# Patient Record
Sex: Female | Born: 1980 | Race: White | Hispanic: No | Marital: Married | Smoking: Never smoker
Health system: Southern US, Community
[De-identification: ages and names within clinical notes are randomized; demographics above are authoritative.]

## PROBLEM LIST (undated history)

## (undated) DIAGNOSIS — N61 Mastitis without abscess: Secondary | ICD-10-CM

## (undated) DIAGNOSIS — G40909 Epilepsy, unspecified, not intractable, without status epilepticus: Secondary | ICD-10-CM

## (undated) DIAGNOSIS — I10 Essential (primary) hypertension: Secondary | ICD-10-CM

## (undated) DIAGNOSIS — J45909 Unspecified asthma, uncomplicated: Secondary | ICD-10-CM

## (undated) HISTORY — PX: EXTERNAL EAR SURGERY: SHX627

## (undated) HISTORY — DX: Mastitis without abscess: N61.0

## (undated) HISTORY — DX: Epilepsy, unspecified, not intractable, without status epilepticus: G40.909

## (undated) HISTORY — DX: Unspecified asthma, uncomplicated: J45.909

## (undated) HISTORY — DX: Essential (primary) hypertension: I10

## (undated) HISTORY — PX: TONSILLECTOMY: SUR1361

---
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
## 2007-07-24 ENCOUNTER — Emergency Department (HOSPITAL_COMMUNITY): Admission: EM | Admit: 2007-07-24 | Discharge: 2007-07-24 | Payer: Self-pay | Admitting: Emergency Medicine

## 2009-07-05 ENCOUNTER — Ambulatory Visit: Payer: Self-pay | Admitting: Internal Medicine

## 2009-08-04 ENCOUNTER — Ambulatory Visit: Payer: Self-pay | Admitting: Internal Medicine

## 2010-10-15 DIAGNOSIS — I10 Essential (primary) hypertension: Secondary | ICD-10-CM

## 2010-10-15 HISTORY — DX: Essential (primary) hypertension: I10

## 2011-04-20 ENCOUNTER — Observation Stay: Payer: Self-pay | Admitting: Obstetrics and Gynecology

## 2011-06-07 ENCOUNTER — Inpatient Hospital Stay: Payer: Self-pay | Admitting: Internal Medicine

## 2011-07-16 ENCOUNTER — Ambulatory Visit: Payer: Self-pay | Admitting: Family Medicine

## 2011-07-25 ENCOUNTER — Ambulatory Visit: Payer: Self-pay | Admitting: General Surgery

## 2011-10-21 ENCOUNTER — Ambulatory Visit: Payer: Self-pay | Admitting: Internal Medicine

## 2011-10-21 LAB — RAPID INFLUENZA A&B ANTIGENS

## 2012-05-23 IMAGING — US US OB US >=[ID] SNGL FETUS
1 series · 17 of 28 positions shown · non-contrast
Comparison: none

REASON FOR EXAM: Elevated BP's at 35 weeks evaluate fetal growth
COMMENTS:

[Series 1: us ob us >=(id) sngl fetus · 17 of 83 slices shown]
[im 1/83]
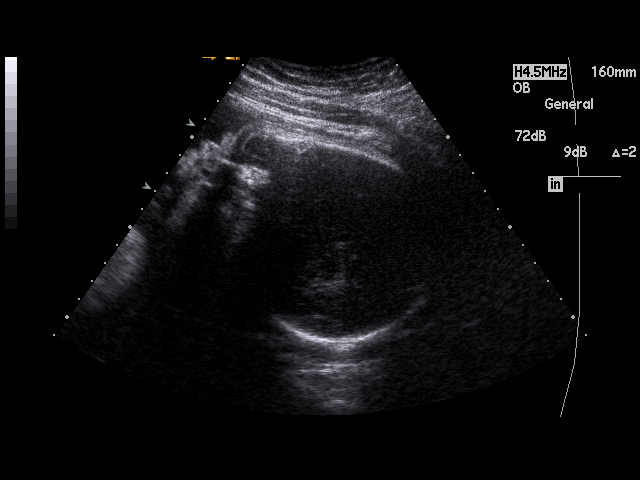
[im 7/83]
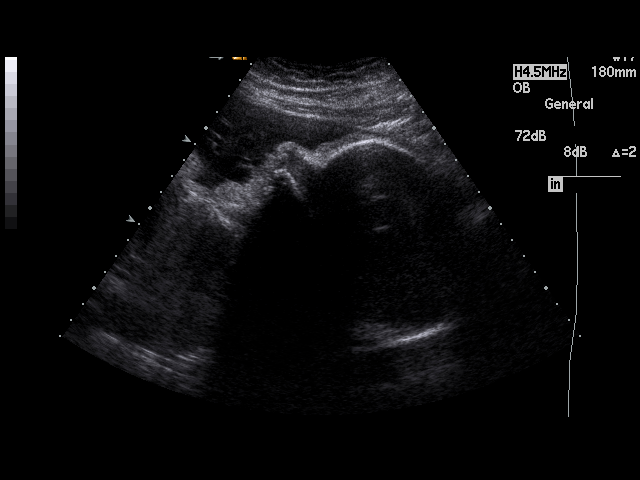
[im 13/83]
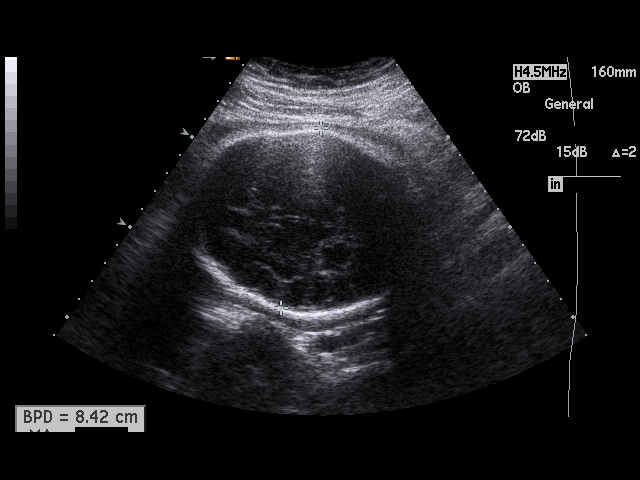
[im 16/83]
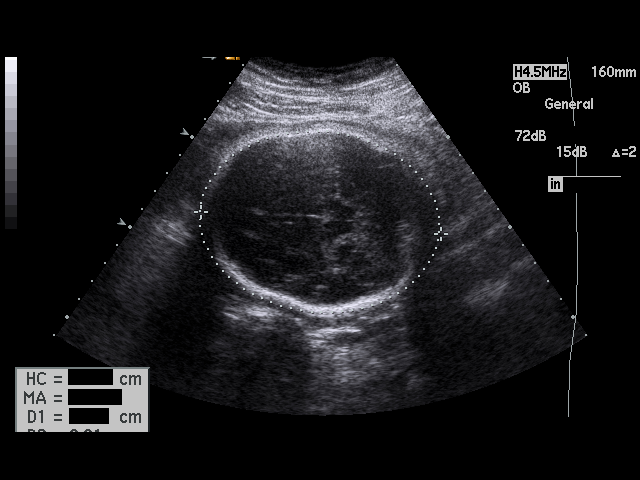
[im 22/83]
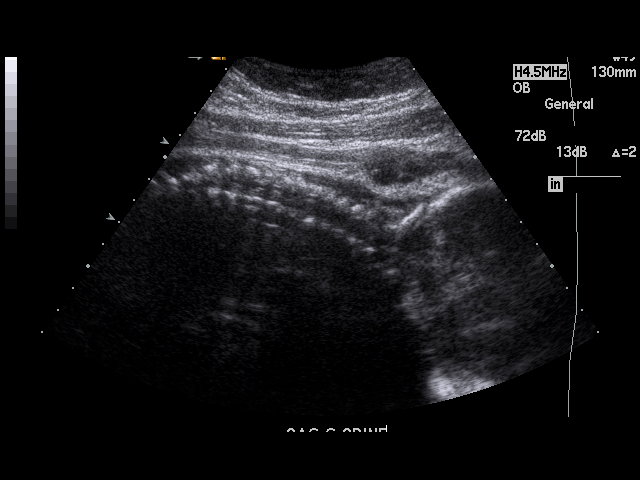
[im 28/83]
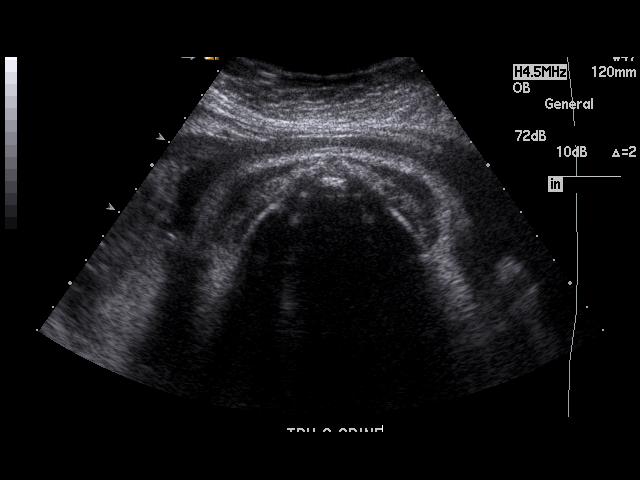
[im 31/83]
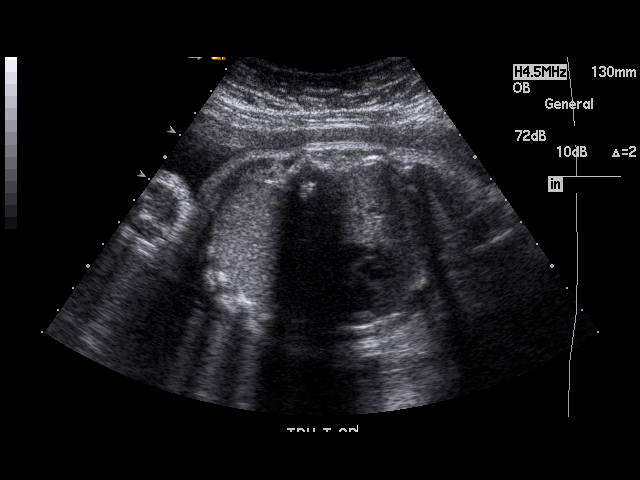
[im 37/83]
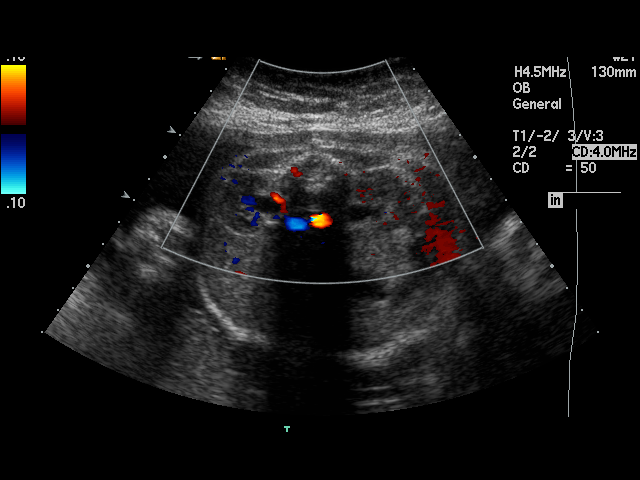
[im 43/83]
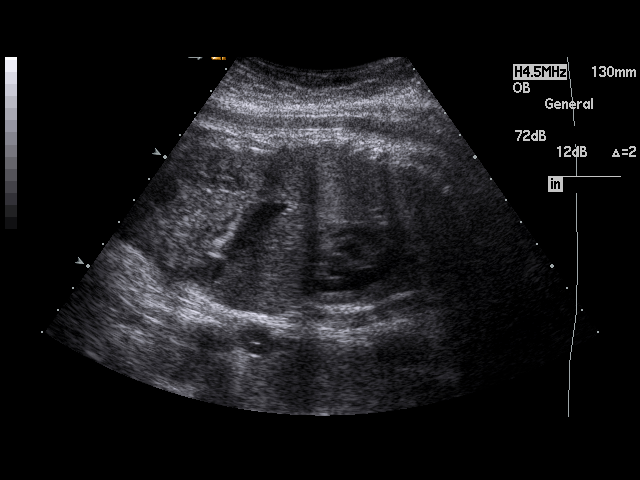
[im 46/83]
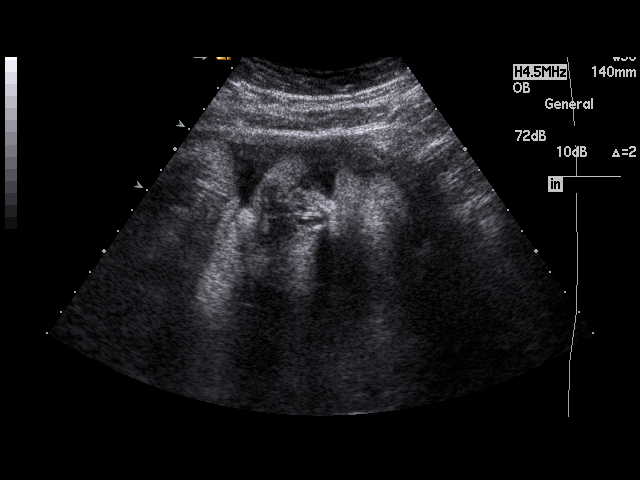
[im 52/83]
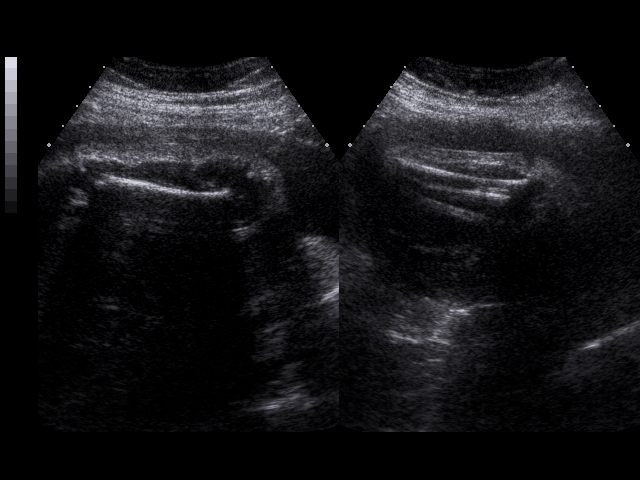
[im 55/83]
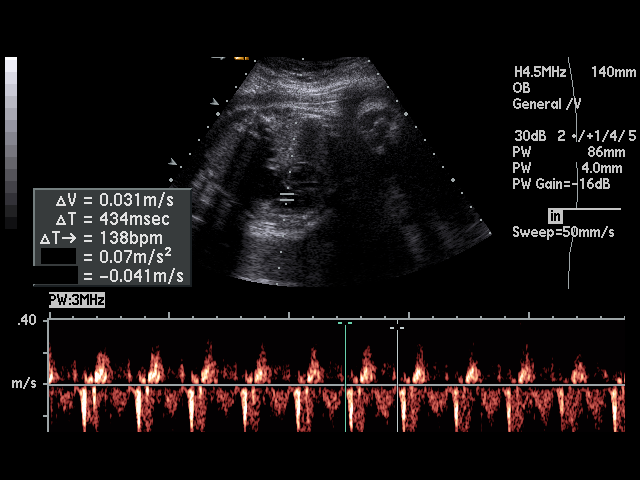
[im 61/83]
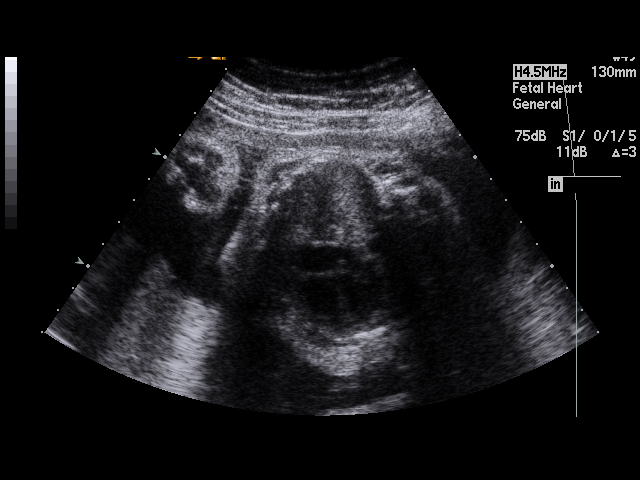
[im 67/83]
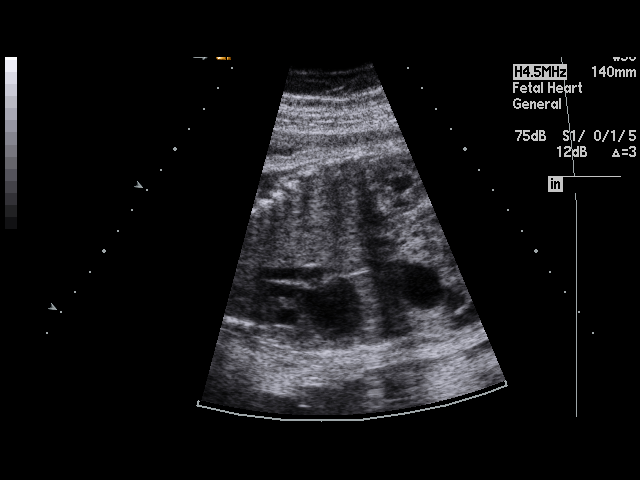
[im 70/83]
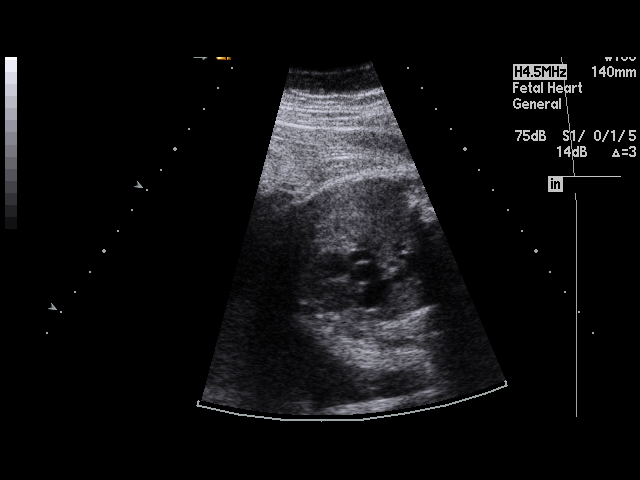
[im 76/83]
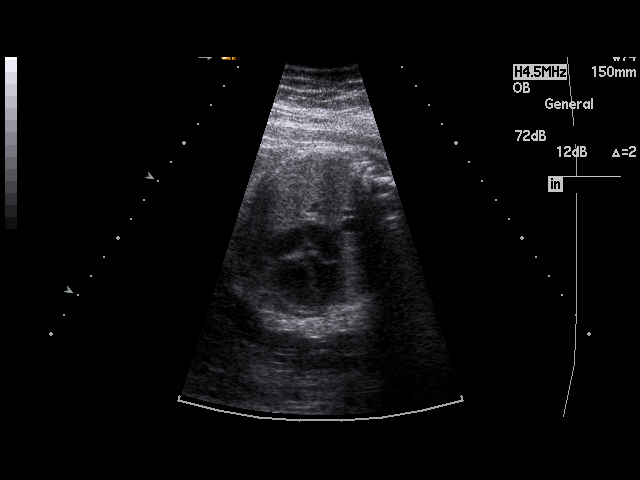
[im 83/83]
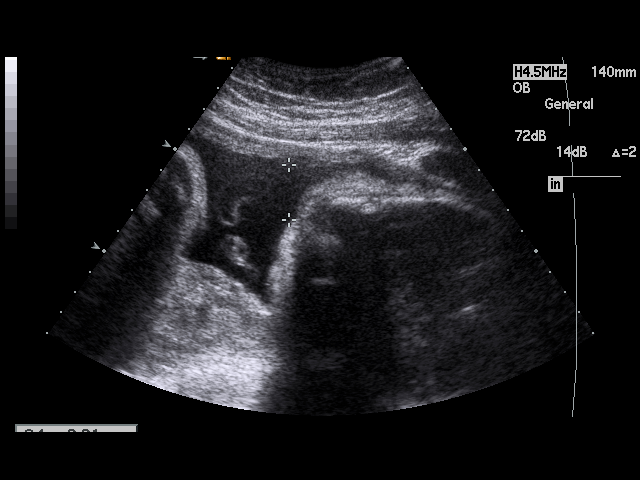

[17 of 28 positions shown; findings below may reference images not displayed]

PROCEDURE:     US  - US OB GREATER/OR EQUAL TO 24327  - June 08, 2011 [DATE]

RESULT:     There is observed a single living intrauterine gestation.
Presentation currently is cephalic. Fetal heart rate was monitored at 138
beats per minute. The placenta is posterior. The inferior tip of the
placenta is approximately 11.3 cm above the cervix. Amnionic fluid volume
appears normal. AFI measures 11.8 cm. The fetal heart, stomach and urinary
bladder are visualized. No hydrocephalus or hydronephrosis is seen.

Fetal measurements are as follows:

          BPD is 8.3 cm, corresponding to 33 weeks, 3 days
           HC is 30.92 cm, corresponding to 34 weeks, 4 days
           AC is 30.77 cm, corresponding to 34 weeks, 5 days
           FL is 6.76 cm, corresponding to 34 weeks, 5 days
           HL is 5.86 cm, corresponding to 34 weeks, 0 days

EFW is 2,446 grams + / - 367 grams. AFI measures 11.8 cm. Average ultrasound
age based on today's measurements is 34 weeks, 2 days and ultrasound EDD
based on today's measurements is July 18, 2011.
IMPRESSION: Please see above.

## 2013-04-28 ENCOUNTER — Encounter: Payer: Self-pay | Admitting: *Deleted

## 2013-05-20 ENCOUNTER — Ambulatory Visit: Payer: Self-pay | Admitting: Obstetrics and Gynecology

## 2013-05-20 LAB — CBC
HCT: 43.6 % (ref 35.0–47.0)
HGB: 15.4 g/dL (ref 12.0–16.0)
MCHC: 35.4 g/dL (ref 32.0–36.0)
RDW: 12.6 % (ref 11.5–14.5)
WBC: 8.8 10*3/uL (ref 3.6–11.0)

## 2013-05-20 LAB — URINALYSIS, COMPLETE
Bilirubin,UR: NEGATIVE
Blood: NEGATIVE
Ketone: NEGATIVE
Leukocyte Esterase: NEGATIVE
Protein: NEGATIVE
RBC,UR: <1 /HPF (ref 0–5)
Specific Gravity: 1.019 (ref 1.003–1.030)
WBC UR: 1 /HPF (ref 0–5)

## 2013-05-21 ENCOUNTER — Ambulatory Visit: Payer: Self-pay | Admitting: Obstetrics and Gynecology

## 2013-05-25 LAB — PATHOLOGY REPORT

## 2014-08-16 ENCOUNTER — Encounter: Payer: Self-pay | Admitting: *Deleted

## 2014-12-22 DIAGNOSIS — O09529 Supervision of elderly multigravida, unspecified trimester: Secondary | ICD-10-CM | POA: Insufficient documentation

## 2014-12-22 DIAGNOSIS — O09891 Supervision of other high risk pregnancies, first trimester: Secondary | ICD-10-CM | POA: Insufficient documentation

## 2014-12-22 DIAGNOSIS — J4520 Mild intermittent asthma, uncomplicated: Secondary | ICD-10-CM | POA: Insufficient documentation

## 2014-12-22 DIAGNOSIS — O10919 Unspecified pre-existing hypertension complicating pregnancy, unspecified trimester: Secondary | ICD-10-CM | POA: Insufficient documentation

## 2014-12-22 DIAGNOSIS — Z7185 Encounter for immunization safety counseling: Secondary | ICD-10-CM | POA: Insufficient documentation

## 2014-12-23 DIAGNOSIS — O99210 Obesity complicating pregnancy, unspecified trimester: Secondary | ICD-10-CM | POA: Insufficient documentation

## 2015-02-04 NOTE — Op Note (Signed)
PATIENT NAME:  Elizabeth Mckee, Elizabeth Mckee MR#:  [id number] DATE OF BIRTH:  12/13/1980  DATE OF PROCEDURE:  05/21/2013  PREOPERATIVE DIAGNOSIS: Missed abortion.   POSTOPERATIVE DIAGNOSIS: Missed abortion.   PROCEDURE: Suction D and C.   ANESTHESIA: LMA.   SURGEON: Senaida LangeLashawn Weaver-Lee, MD.   ESTIMATED BLOOD LOSS: 50 mL.  OPERATIVE FLUIDS: 400 mL.   COMPLICATIONS: None.   FINDINGS: Products of conception.   INDICATIONS: The patient is a 34 year old, who presents with a missed AB, who desired surgical management. Risks, benefits, indications and alternatives of the procedure were explained and informed consent was obtained.   PROCEDURE: The patient was taken to the operating room with IV fluids running. She was prepped and draped in the usual sterile fashion and placed in candy cane stirrups. A speculum was placed inside of the vagina. The anterior lip of the cervix was grasped with a single-tooth  tenaculum. The uterus was sounded. The cervix was dilated using Hank dilators. An 8 mm suction curette was placed inside of the uterus and products of conception were removed and seen coming through the curette. After suctioning, the uterus was gently curetted using both serrated and straight edge banjo curettes. A gritty feeling was noted to the uterus indicating that all products had been removed. This ended the procedure. The patient tolerated the procedure well. Sponge and instrument counts were correct x 2. The patient was awakened from anesthesia and taken to the recovery room.    ____________________________ Sonda PrimesLashawn A. Patton SallesWeaver-Lee, MD law:aw D: 05/21/2013 12:42:22 ET T: 05/21/2013 13:15:29 ET JOB#: [id number]  cc: Flint MelterLashawn A. Patton SallesWeaver-Lee, MD, <Dictator> Sonda PrimesLASHAWN A WEAVER LEE MD ELECTRONICALLY SIGNED 05/21/2013 15:50

## 2015-11-17 ENCOUNTER — Encounter: Payer: Self-pay | Admitting: *Deleted

## 2016-01-30 ENCOUNTER — Encounter: Payer: Self-pay | Admitting: General Surgery

## 2016-01-31 ENCOUNTER — Ambulatory Visit: Payer: Self-pay | Admitting: General Surgery

## 2016-03-05 ENCOUNTER — Encounter: Payer: Self-pay | Admitting: *Deleted

## 2016-03-21 ENCOUNTER — Encounter: Payer: Self-pay | Admitting: *Deleted

## 2018-01-03 DIAGNOSIS — H7292 Unspecified perforation of tympanic membrane, left ear: Secondary | ICD-10-CM | POA: Insufficient documentation

## 2018-01-03 DIAGNOSIS — H6690 Otitis media, unspecified, unspecified ear: Secondary | ICD-10-CM | POA: Insufficient documentation

## 2022-02-08 ENCOUNTER — Ambulatory Visit (INDEPENDENT_AMBULATORY_CARE_PROVIDER_SITE_OTHER): Payer: BLUE CROSS/BLUE SHIELD

## 2022-02-08 ENCOUNTER — Ambulatory Visit
Admission: EM | Admit: 2022-02-08 | Discharge: 2022-02-08 | Disposition: A | Discharge status: Home / Self Care | Payer: BLUE CROSS/BLUE SHIELD | Attending: Emergency Medicine | Admitting: Emergency Medicine

## 2022-02-08 DIAGNOSIS — M25572 Pain in left ankle and joints of left foot: Secondary | ICD-10-CM | POA: Diagnosis not present

## 2022-02-08 DIAGNOSIS — S82842A Displaced bimalleolar fracture of left lower leg, initial encounter for closed fracture: Secondary | ICD-10-CM

## 2022-02-08 MED ORDER — HYDROCODONE-ACETAMINOPHEN 5-325 MG PO TABS: 1.0000 | ORAL_TABLET | Freq: Four times a day (QID) | ORAL | 0 refills | Status: AC | PRN

## 2022-02-08 NOTE — ED Triage Notes (Signed)
Patient is here for "Left ankle pain". DOI: [id number]. "@ [place], walking, hitting a slick patch, ankle rolled and collapsed, causing ankle pain, swelling". No head injury. No lacerations.  ?

## 2022-02-08 NOTE — Discharge Instructions (Addendum)
Follow up with ortho in 1-4 ?Elevate leg when resting.  ?Apply ice on area  with cloth over the skin for 20 minutes 3-4 times a day today and tomorrow.  ?

## 2022-02-08 NOTE — ED Notes (Signed)
LL (Long leg, posterior splint applied) with assistance from family member. Instructions, educations and information provided while placing (per protocol). Lurline Idol CMA ?

## 2022-02-08 NOTE — ED Provider Notes (Signed)
?[facility] ? ? ? ?CSN: [id number] ?Arrival date & time: 02/08/22  1814 ? ? ?  ? ?History   ?Chief Complaint ?Chief Complaint  ?Patient presents with  ? Ankle Pain  ?  Left, Due to injury.  ? ? ?HPI ?Elizabeth Mckee is a 41 y.o. female who presents with L ankle pain since she slipped and felled and twisted her L ankle. She fell down, but did not hit her head. Only has pain in her ankle.  ? ? ? ?Past Medical History:  ?Diagnosis Date  ? Asthma   ? Epilepsy (HCC) 1984  ? Hypertension 2012  ? Mastitis   ? ? ?Patient Active Problem List  ? Diagnosis Date Noted  ? Perforation of left tympanic membrane 01/03/2018  ? Ear infection 01/03/2018  ? Postpartum care following vaginal delivery 07/22/2015  ? Obesity affecting pregnancy 12/23/2014  ? Supervision of other high risk pregnancies, first trimester 12/22/2014  ? Mild intermittent asthma without complication 12/22/2014  ? Immunization counseling 12/22/2014  ? Chronic hypertension during pregnancy, antepartum 12/22/2014  ? AMA (advanced maternal age) multigravida 35+ 12/22/2014  ? ? ?Past Surgical History:  ?Procedure Laterality Date  ? EXTERNAL EAR SURGERY  1981-1988  ? tubes eardrum reconstruction   ? TONSILLECTOMY    ? wisdom teeth removal   2000  ? ? ?OB History   ? ? Gravida  ?1  ? Para  ?1  ? Term  ?   ? Preterm  ?   ? AB  ?   ? Living  ?1  ?  ? ? SAB  ?   ? IAB  ?   ? Ectopic  ?   ? Multiple  ?   ? Live Births  ?   ?   ?  ? Obstetric Comments  ?1st Menstrual Cycle:  12 ?1st Pregnancy:  30  ?  ? ?  ? ? ? ?Home Medications   ? ?Prior to Admission medications   ?Medication Sig Start Date End Date Taking? Authorizing Provider  ?ADVAIR DISKUS 250-50 MCG/ACT AEPB Inhale 1 puff into the lungs 2 (two) times daily. 01/19/22  Yes [provider]  ?albuterol (VENTOLIN HFA) 108 (90 Base) MCG/ACT inhaler Inhale 2 puffs into the lungs every 4 (four) hours as needed. 01/29/22  Yes [provider]  ?amLODipine (NORVASC) 10 MG tablet Take 10 mg by mouth  daily. 01/16/22  Yes [provider]  ?cetirizine (ZYRTEC) 10 MG tablet Take by mouth.   Yes [provider]  ?ciprofloxacin-dexamethasone (CIPRODEX) OTIC suspension Administer 4 drops into the left ear Two (2) times a day. 06/23/18  Yes [provider]  ?fluticasone (FLONASE) 50 MCG/ACT nasal spray 1 spray by Each Nare route daily.   Yes [provider]  ?Multiple Vitamin (MULTI-VITAMIN PO) Take 1 tablet by mouth daily.   Yes [provider]  ?sertraline (ZOLOFT) 50 MG tablet Take 50 mg by mouth daily. 01/29/22  Yes [provider]  ? ? ?Family History ?No family history on file. ? ?Social History ?Social History  ? ?Tobacco Use  ? Smoking status: Never  ? Smokeless tobacco: Never  ?Vaping Use  ? Vaping Use: Never used  ?Substance Use Topics  ? Alcohol use: Yes  ?  Comment: Occ.  ? Drug use: No  ? ? ? ?Allergies   ?Phenobarbital; Sulfa antibiotics; Antihistamines, chlorpheniramine-type; and Mephobarbital ? ? ?Review of Systems ?Review of Systems  ?Musculoskeletal:  Positive for arthralgias, gait problem and joint  swelling.  ?Skin:  Negative for color change, rash and wound.  ? ? ?Physical Exam ?Triage Vital Signs ?ED Triage Vitals  ?Enc Vitals Group  ?   BP 02/08/22 1824 (!) 138/103  ?   Pulse Rate 02/08/22 1824 (!) 116  ?   Resp 02/08/22 1824 18  ?   Temp 02/08/22 1824 99.2 ?F (37.3 ?C)  ?   Temp Source 02/08/22 1824 Oral  ?   SpO2 02/08/22 1824 97 %  ?   Weight 02/08/22 1821 195 lb (88.5 kg)  ?   Height 02/08/22 1821 5\' 1"  (1.549 m)  ?   Head Circumference --   ?   Peak Flow --   ?   Pain Score 02/08/22 1818 6  ?   Pain Loc --   ?   Pain Edu? --   ?   Excl. in GC? --   ? ?No data found. ? ?Updated Vital Signs ?BP (!) 134/99 (BP Location: Left Arm)   Pulse (!) 116   Temp 99.2 ?F (37.3 ?C) (Oral)   Resp 18   Ht 5\' 1"  (1.549 m)   Wt 195 lb (88.5 kg)   LMP 01/14/2022 (Exact Date)   SpO2 97%   BMI 36.84 kg/m?  ? ?Visual Acuity ?Right Eye Distance:   ?Left Eye  Distance:   ?Bilateral Distance:   ? ?Right Eye Near:   ?Left Eye Near:    ?Bilateral Near:    ? ?Physical Exam ?Vitals and nursing note reviewed.  ?Constitutional:   ?   Appearance: She is obese.  ?   Comments: Who is unable to bare weight and is sitting on a wheelchair.   ?HENT:  ?   Right Ear: External ear normal.  ?   Left Ear: External ear normal.  ?Eyes:  ?   General: No scleral icterus. ?   Conjunctiva/sclera: Conjunctivae normal.  ?Musculoskeletal:  ?   Comments: L FOOT- normal with no tenderness ?L ANKLE- with moderate swelling of lateral malleolus, and limited ROM dur to pain. Achillis tendon is intact.   ?Skin: ?   General: Skin is warm and dry.  ?   Findings: No bruising or rash.  ?Neurological:  ?   Mental Status: She is alert and oriented to person, place, and time.  ?   Gait: Gait abnormal.  ?Psychiatric:     ?   Mood and Affect: Mood normal.     ?   Behavior: Behavior normal.     ?   Thought Content: Thought content normal.     ?   Judgment: Judgment normal.  ? ? ? ?UC Treatments / Results  ?Labs ?(all labs ordered are listed, but only abnormal results are displayed) ?Labs Reviewed - No data to display ? ?EKG ? ? ?Radiology ?No results found. ? ?Procedures ?Procedures (including critical care time) ? ?Medications Ordered in UC ?Medications - No data to display ? ?Initial Impression / Assessment and Plan / UC Course  ?I have reviewed the triage vital signs and the nursing notes. ? ?Pertinent  imaging results that were available during my care of the patient were reviewed by me and considered in my medical decision making (see chart for details). ? ? L ankle fracture ? ?Placed on a long posterior leg splint and crutches. No wt barring til sees ortho in 1-4 days.  ?I prescribed her norco as noted.  ?Final Clinical Impressions(s) / UC Diagnoses  ? ?Final diagnoses:  ?None  ? ?Discharge  Instructions   ?None ?  ? ?ED Prescriptions   ?None ?  ? ?PDMP not reviewed this encounter. ?  ?Garey HamRodriguez-Southworth,  Sapna Padron, PA-C ?02/08/22 1954 ? ?

## 2023-01-24 IMAGING — CR DG ANKLE COMPLETE 3+V*L*
3 series · 3 of 3 positions shown · non-contrast
Comparison: None

CLINICAL DATA: Left ankle injury. Rolled ankle with pain and
swelling.

EXAM:
LEFT ANKLE COMPLETE - 3+ VIEW

[ankle ap]
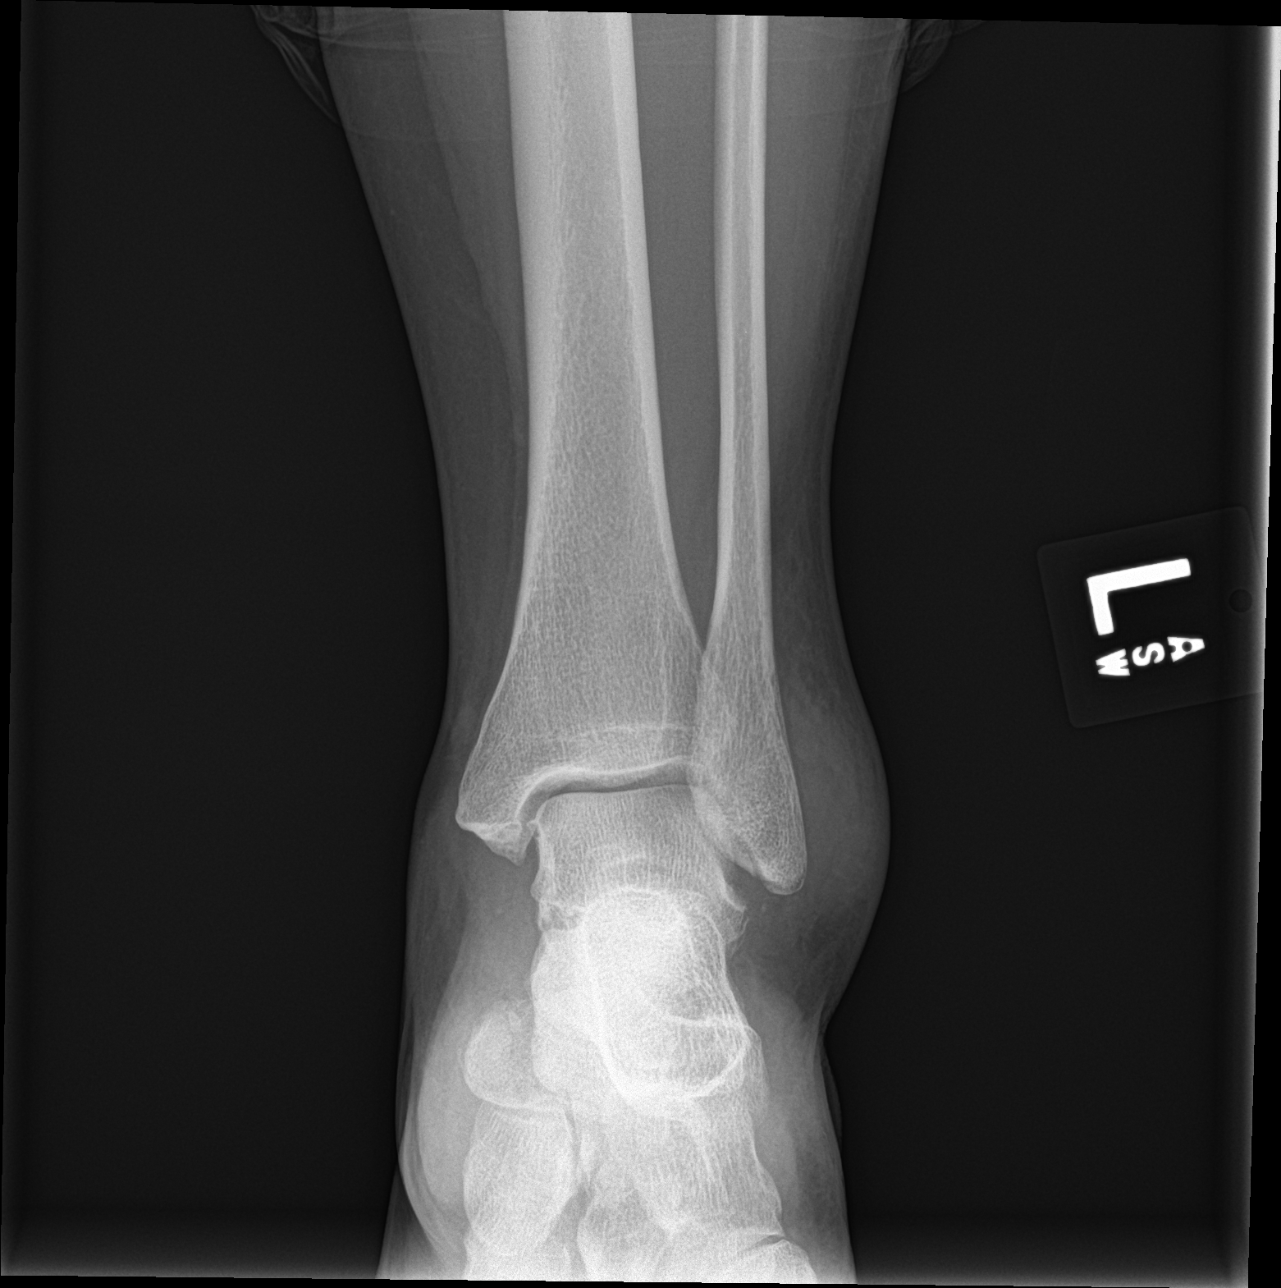

[ankle obl]
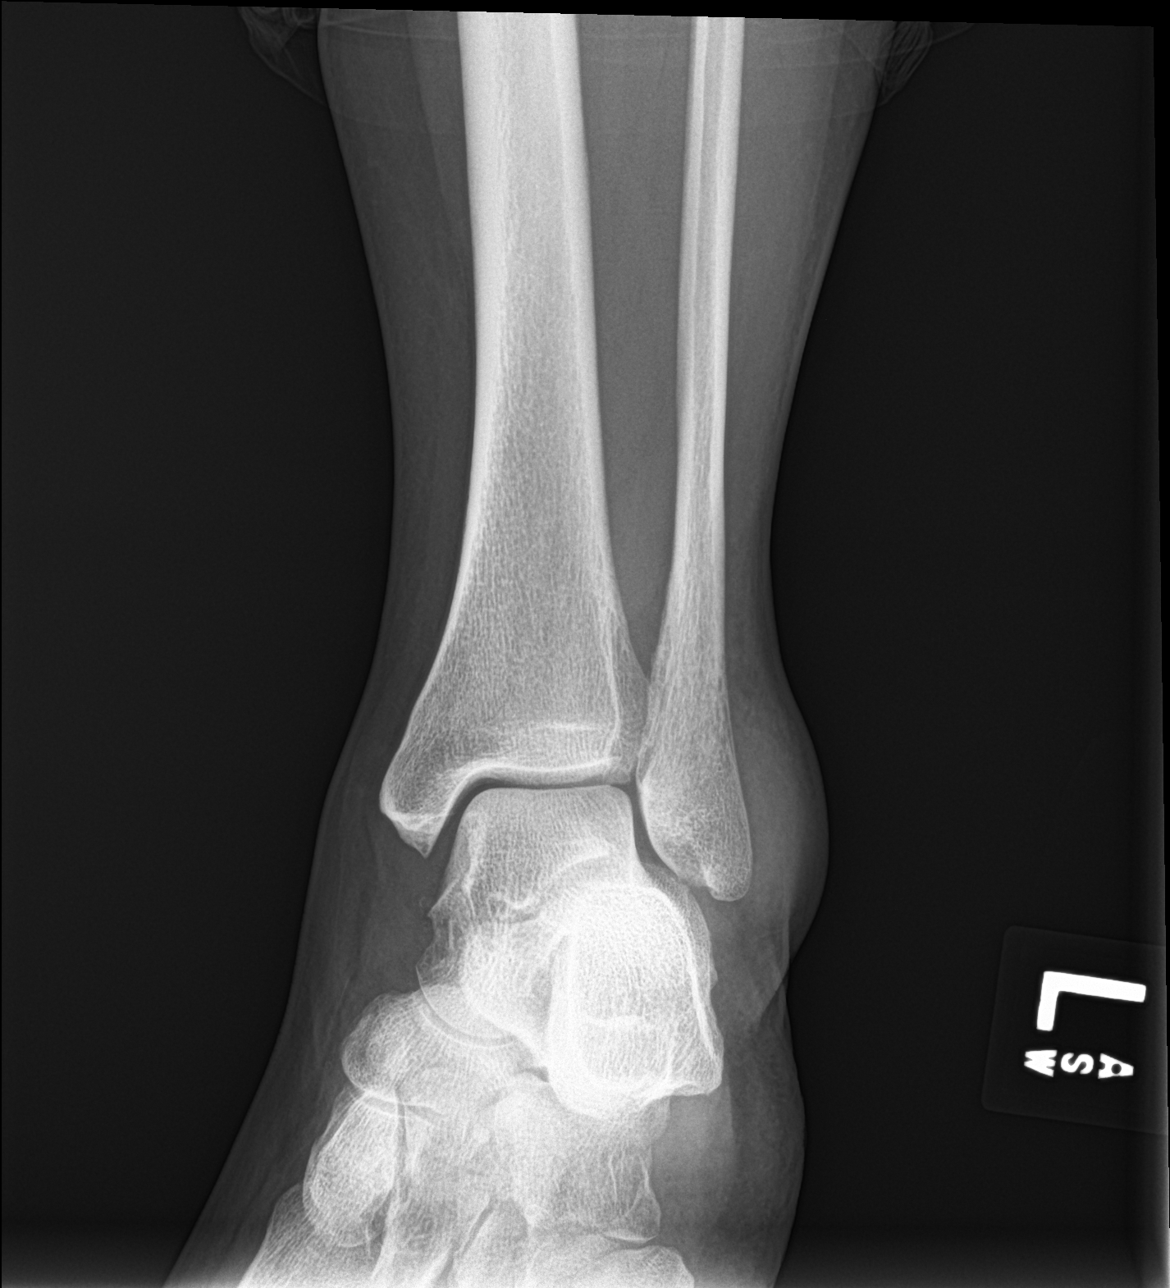

[ankle lat]
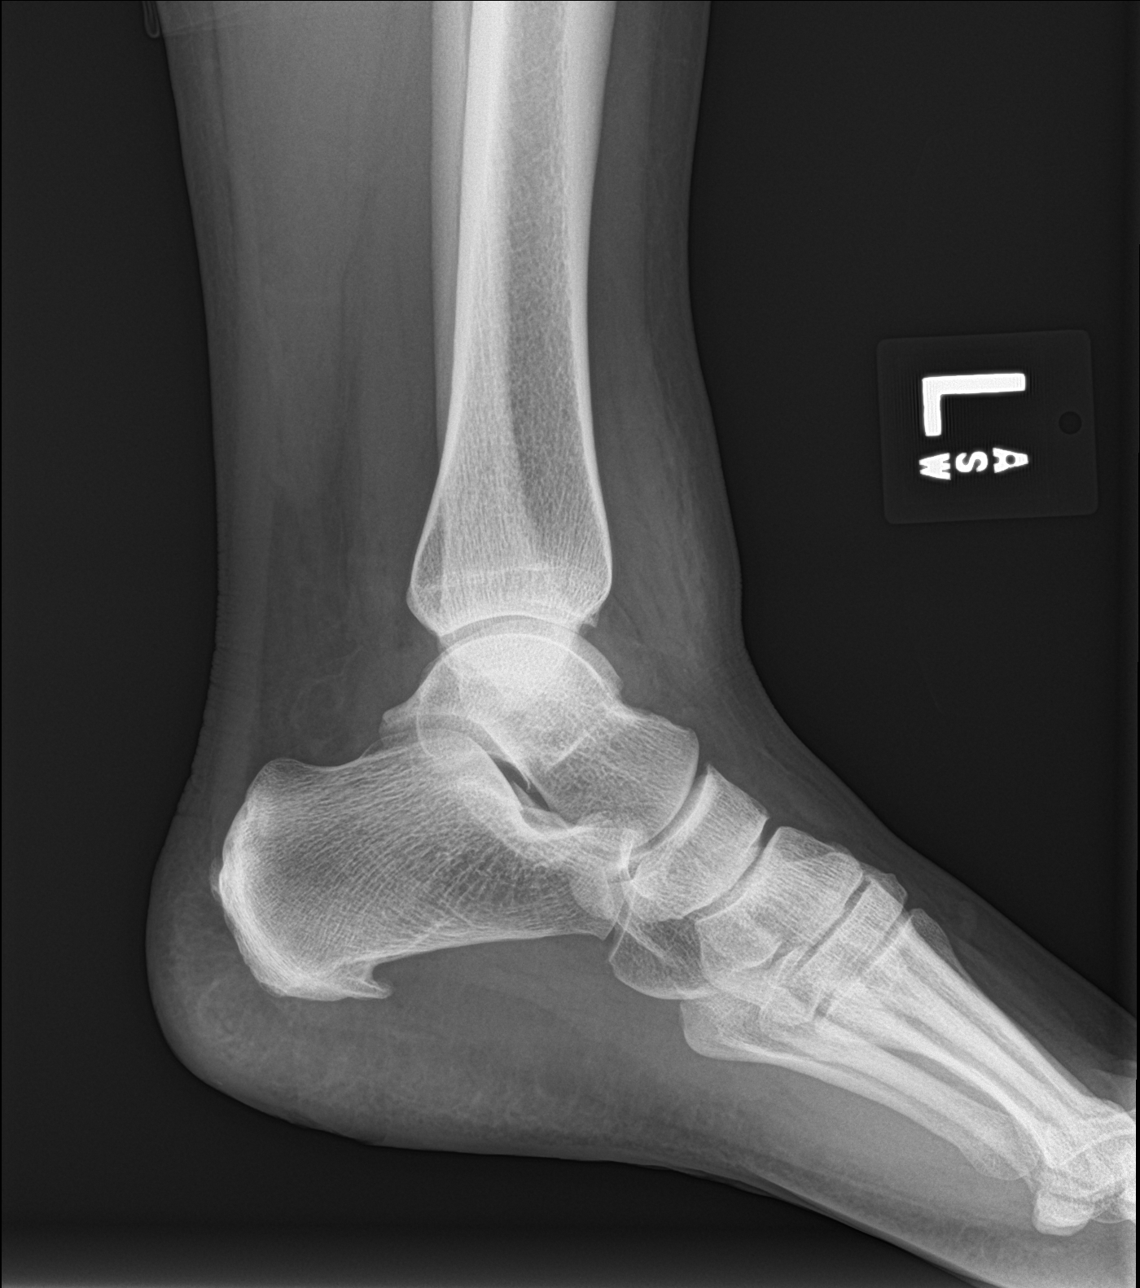

[3 of 3 positions shown; findings below may reference images not displayed]

FINDINGS: Moderate to high-grade lateral malleolar soft tissue swelling. Tiny
1-2 mm mineralization distal to the fibula and lateral to the talus.
Mild irregularity of the lateral talar cortex suspicious for a
lateral talar process superficial cortical fracture.

The ankle mortise remains symmetric and intact. Small plantar
calcaneal heel spur. Minimal chronic spurring at the Achilles
insertion on the calcaneus.
IMPRESSION: Superficial cortical fracture of the lateral process of the talus
(snowboarders fracture"). Moderate to high-grade lateral malleolar
soft tissue swelling.
